# Patient Record
Sex: Female | Born: 1955 | Race: White | Hispanic: No | Marital: Married
Health system: Southern US, Community
[De-identification: ages and names within clinical notes are randomized; demographics above are authoritative.]

---

## 2000-01-27 ENCOUNTER — Encounter: Admission: RE | Admit: 2000-01-27 | Discharge: 2000-01-27 | Payer: Self-pay | Admitting: *Deleted

## 2000-01-27 ENCOUNTER — Encounter: Payer: Self-pay | Admitting: *Deleted

## 2001-04-11 ENCOUNTER — Other Ambulatory Visit: Admission: RE | Admit: 2001-04-11 | Discharge: 2001-04-11 | Payer: Self-pay | Admitting: *Deleted

## 2001-04-17 ENCOUNTER — Encounter: Admission: RE | Admit: 2001-04-17 | Discharge: 2001-04-17 | Payer: Self-pay | Admitting: *Deleted

## 2001-04-17 ENCOUNTER — Encounter: Payer: Self-pay | Admitting: *Deleted

## 2002-05-16 ENCOUNTER — Encounter: Payer: Self-pay | Admitting: Internal Medicine

## 2002-05-16 ENCOUNTER — Encounter: Admission: RE | Admit: 2002-05-16 | Discharge: 2002-05-16 | Payer: Self-pay | Admitting: Internal Medicine

## 2002-06-17 ENCOUNTER — Other Ambulatory Visit: Admission: RE | Admit: 2002-06-17 | Discharge: 2002-06-17 | Payer: Self-pay | Admitting: Internal Medicine

## 2002-06-24 ENCOUNTER — Encounter: Admission: RE | Admit: 2002-06-24 | Discharge: 2002-06-24 | Payer: Self-pay | Admitting: Internal Medicine

## 2002-06-24 ENCOUNTER — Encounter: Payer: Self-pay | Admitting: Internal Medicine

## 2003-08-27 ENCOUNTER — Encounter: Payer: Self-pay | Admitting: Internal Medicine

## 2003-08-27 ENCOUNTER — Encounter: Admission: RE | Admit: 2003-08-27 | Discharge: 2003-08-27 | Payer: Self-pay | Admitting: Internal Medicine

## 2004-10-21 ENCOUNTER — Ambulatory Visit (HOSPITAL_COMMUNITY): Admission: RE | Admit: 2004-10-21 | Discharge: 2004-10-21 | Payer: Self-pay | Admitting: Internal Medicine

## 2005-02-10 ENCOUNTER — Other Ambulatory Visit: Admission: RE | Admit: 2005-02-10 | Discharge: 2005-02-10 | Payer: Self-pay | Admitting: Internal Medicine

## 2005-02-24 ENCOUNTER — Encounter: Admission: RE | Admit: 2005-02-24 | Discharge: 2005-02-24 | Payer: Self-pay | Admitting: Internal Medicine

## 2006-01-13 ENCOUNTER — Ambulatory Visit (HOSPITAL_COMMUNITY): Admission: RE | Admit: 2006-01-13 | Discharge: 2006-01-13 | Payer: Self-pay | Admitting: Internal Medicine

## 2006-03-30 ENCOUNTER — Other Ambulatory Visit: Admission: RE | Admit: 2006-03-30 | Discharge: 2006-03-30 | Payer: Self-pay | Admitting: Internal Medicine

## 2006-04-06 ENCOUNTER — Encounter: Admission: RE | Admit: 2006-04-06 | Discharge: 2006-04-06 | Payer: Self-pay | Admitting: Internal Medicine

## 2007-05-03 ENCOUNTER — Ambulatory Visit (HOSPITAL_COMMUNITY): Admission: RE | Admit: 2007-05-03 | Discharge: 2007-05-03 | Payer: Self-pay | Admitting: Family Medicine

## 2007-06-01 ENCOUNTER — Other Ambulatory Visit: Admission: RE | Admit: 2007-06-01 | Discharge: 2007-06-01 | Payer: Self-pay | Admitting: Family Medicine

## 2007-10-25 ENCOUNTER — Ambulatory Visit (HOSPITAL_COMMUNITY): Admission: RE | Admit: 2007-10-25 | Discharge: 2007-10-25 | Payer: Self-pay | Admitting: Gastroenterology

## 2008-05-15 ENCOUNTER — Ambulatory Visit (HOSPITAL_COMMUNITY): Admission: RE | Admit: 2008-05-15 | Discharge: 2008-05-15 | Payer: Self-pay

## 2009-07-17 ENCOUNTER — Ambulatory Visit (HOSPITAL_COMMUNITY): Admission: RE | Admit: 2009-07-17 | Discharge: 2009-07-17 | Payer: Self-pay | Admitting: Family Medicine

## 2010-09-16 ENCOUNTER — Ambulatory Visit (HOSPITAL_COMMUNITY): Admission: RE | Admit: 2010-09-16 | Discharge: 2010-09-16 | Payer: Self-pay | Admitting: Family Medicine

## 2011-07-07 ENCOUNTER — Other Ambulatory Visit: Payer: Self-pay | Admitting: Family Medicine

## 2011-07-07 ENCOUNTER — Other Ambulatory Visit (HOSPITAL_COMMUNITY)
Admission: RE | Admit: 2011-07-07 | Discharge: 2011-07-07 | Disposition: A | Discharge status: Home / Self Care | Payer: BC Managed Care – PPO | Source: Ambulatory Visit | Attending: Family Medicine | Admitting: Family Medicine

## 2011-07-07 DIAGNOSIS — Z124 Encounter for screening for malignant neoplasm of cervix: Secondary | ICD-10-CM | POA: Insufficient documentation

## 2011-07-08 ENCOUNTER — Other Ambulatory Visit (HOSPITAL_COMMUNITY): Payer: Self-pay | Admitting: Family Medicine

## 2011-07-08 DIAGNOSIS — Z1231 Encounter for screening mammogram for malignant neoplasm of breast: Secondary | ICD-10-CM

## 2011-09-22 ENCOUNTER — Ambulatory Visit (HOSPITAL_COMMUNITY)
Admission: RE | Admit: 2011-09-22 | Discharge: 2011-09-22 | Disposition: A | Discharge status: Home / Self Care | Payer: BC Managed Care – PPO | Source: Ambulatory Visit | Attending: Family Medicine | Admitting: Family Medicine

## 2011-09-22 DIAGNOSIS — Z1231 Encounter for screening mammogram for malignant neoplasm of breast: Secondary | ICD-10-CM

## 2012-09-28 ENCOUNTER — Other Ambulatory Visit (HOSPITAL_COMMUNITY): Payer: Self-pay | Admitting: Family Medicine

## 2012-09-28 DIAGNOSIS — Z1231 Encounter for screening mammogram for malignant neoplasm of breast: Secondary | ICD-10-CM

## 2012-10-17 ENCOUNTER — Ambulatory Visit (HOSPITAL_COMMUNITY)
Admission: RE | Admit: 2012-10-17 | Discharge: 2012-10-17 | Disposition: A | Discharge status: Home / Self Care | Payer: BC Managed Care – PPO | Source: Ambulatory Visit | Attending: Family Medicine | Admitting: Family Medicine

## 2012-10-17 DIAGNOSIS — Z1231 Encounter for screening mammogram for malignant neoplasm of breast: Secondary | ICD-10-CM

## 2013-05-22 ENCOUNTER — Other Ambulatory Visit (HOSPITAL_COMMUNITY): Payer: Self-pay | Admitting: Family Medicine

## 2013-05-22 DIAGNOSIS — M810 Age-related osteoporosis without current pathological fracture: Secondary | ICD-10-CM

## 2013-09-23 ENCOUNTER — Ambulatory Visit (HOSPITAL_COMMUNITY)
Admission: RE | Admit: 2013-09-23 | Discharge: 2013-09-23 | Disposition: A | Discharge status: Home / Self Care | Payer: BC Managed Care – PPO | Source: Ambulatory Visit | Attending: Family Medicine | Admitting: Family Medicine

## 2013-09-23 DIAGNOSIS — Z13820 Encounter for screening for osteoporosis: Secondary | ICD-10-CM | POA: Insufficient documentation

## 2013-09-23 DIAGNOSIS — M810 Age-related osteoporosis without current pathological fracture: Secondary | ICD-10-CM

## 2013-09-23 DIAGNOSIS — Z780 Asymptomatic menopausal state: Secondary | ICD-10-CM | POA: Insufficient documentation

## 2013-10-30 ENCOUNTER — Other Ambulatory Visit (HOSPITAL_COMMUNITY): Payer: Self-pay | Admitting: Family Medicine

## 2013-10-30 ENCOUNTER — Ambulatory Visit (HOSPITAL_COMMUNITY): Payer: BC Managed Care – PPO | Attending: Family Medicine

## 2013-10-30 DIAGNOSIS — Z1231 Encounter for screening mammogram for malignant neoplasm of breast: Secondary | ICD-10-CM

## 2013-11-08 ENCOUNTER — Ambulatory Visit (HOSPITAL_COMMUNITY)
Admission: RE | Admit: 2013-11-08 | Discharge: 2013-11-08 | Disposition: A | Discharge status: Home / Self Care | Payer: BC Managed Care – PPO | Source: Ambulatory Visit | Attending: Family Medicine | Admitting: Family Medicine

## 2013-11-08 DIAGNOSIS — Z1231 Encounter for screening mammogram for malignant neoplasm of breast: Secondary | ICD-10-CM | POA: Insufficient documentation

## 2014-11-17 ENCOUNTER — Other Ambulatory Visit (HOSPITAL_COMMUNITY): Payer: Self-pay | Admitting: Family Medicine

## 2014-11-17 DIAGNOSIS — Z1231 Encounter for screening mammogram for malignant neoplasm of breast: Secondary | ICD-10-CM

## 2014-11-18 ENCOUNTER — Ambulatory Visit (HOSPITAL_COMMUNITY)
Admission: RE | Admit: 2014-11-18 | Discharge: 2014-11-18 | Disposition: A | Discharge status: Home / Self Care | Payer: BC Managed Care – PPO | Source: Ambulatory Visit | Attending: Family Medicine | Admitting: Family Medicine

## 2014-11-18 DIAGNOSIS — Z1231 Encounter for screening mammogram for malignant neoplasm of breast: Secondary | ICD-10-CM | POA: Diagnosis present

## 2016-01-07 ENCOUNTER — Other Ambulatory Visit: Payer: Self-pay

## 2016-01-07 DIAGNOSIS — Z1231 Encounter for screening mammogram for malignant neoplasm of breast: Secondary | ICD-10-CM

## 2016-01-20 ENCOUNTER — Ambulatory Visit
Admission: RE | Admit: 2016-01-20 | Discharge: 2016-01-20 | Disposition: A | Discharge status: Home / Self Care | Payer: BLUE CROSS/BLUE SHIELD | Source: Ambulatory Visit

## 2016-01-20 DIAGNOSIS — Z1231 Encounter for screening mammogram for malignant neoplasm of breast: Secondary | ICD-10-CM

## 2016-04-27 ENCOUNTER — Other Ambulatory Visit (HOSPITAL_COMMUNITY)
Admission: RE | Admit: 2016-04-27 | Discharge: 2016-04-27 | Disposition: A | Discharge status: Home / Self Care | Payer: BLUE CROSS/BLUE SHIELD | Source: Ambulatory Visit | Attending: Family Medicine | Admitting: Family Medicine

## 2016-04-27 ENCOUNTER — Other Ambulatory Visit: Payer: Self-pay | Admitting: Family Medicine

## 2016-04-27 DIAGNOSIS — Z124 Encounter for screening for malignant neoplasm of cervix: Secondary | ICD-10-CM | POA: Diagnosis present

## 2016-04-27 DIAGNOSIS — Z1151 Encounter for screening for human papillomavirus (HPV): Secondary | ICD-10-CM | POA: Insufficient documentation

## 2016-04-28 LAB — CYTOLOGY - PAP

## 2016-05-10 ENCOUNTER — Other Ambulatory Visit: Payer: Self-pay | Admitting: Surgery

## 2016-05-11 ENCOUNTER — Other Ambulatory Visit: Payer: Self-pay | Admitting: Surgery

## 2016-05-11 DIAGNOSIS — R1901 Right upper quadrant abdominal swelling, mass and lump: Secondary | ICD-10-CM

## 2016-05-16 ENCOUNTER — Ambulatory Visit
Admission: RE | Admit: 2016-05-16 | Discharge: 2016-05-16 | Disposition: A | Discharge status: Home / Self Care | Payer: BLUE CROSS/BLUE SHIELD | Source: Ambulatory Visit | Attending: Surgery | Admitting: Surgery

## 2016-05-16 DIAGNOSIS — R1901 Right upper quadrant abdominal swelling, mass and lump: Secondary | ICD-10-CM

## 2016-05-16 MED ORDER — IOPAMIDOL (ISOVUE-300) INJECTION 61%
100.0000 mL | Freq: Once | INTRAVENOUS | Status: AC | PRN
Start: 2016-05-16 — End: 2016-05-16
  Administered 2016-05-16: 100 mL via INTRAVENOUS

## 2017-03-06 ENCOUNTER — Other Ambulatory Visit: Payer: Self-pay | Admitting: Family Medicine

## 2017-03-06 DIAGNOSIS — Z1231 Encounter for screening mammogram for malignant neoplasm of breast: Secondary | ICD-10-CM

## 2017-03-28 ENCOUNTER — Ambulatory Visit
Admission: RE | Admit: 2017-03-28 | Discharge: 2017-03-28 | Disposition: A | Discharge status: Home / Self Care | Payer: BLUE CROSS/BLUE SHIELD | Source: Ambulatory Visit | Attending: Family Medicine | Admitting: Family Medicine

## 2017-03-28 DIAGNOSIS — Z1231 Encounter for screening mammogram for malignant neoplasm of breast: Secondary | ICD-10-CM

## 2018-01-29 DIAGNOSIS — H2513 Age-related nuclear cataract, bilateral: Secondary | ICD-10-CM | POA: Diagnosis not present

## 2018-01-29 DIAGNOSIS — H401131 Primary open-angle glaucoma, bilateral, mild stage: Secondary | ICD-10-CM | POA: Diagnosis not present

## 2018-04-26 ENCOUNTER — Other Ambulatory Visit: Payer: Self-pay | Admitting: Family Medicine

## 2018-04-26 DIAGNOSIS — Z1231 Encounter for screening mammogram for malignant neoplasm of breast: Secondary | ICD-10-CM

## 2018-05-14 DIAGNOSIS — H2513 Age-related nuclear cataract, bilateral: Secondary | ICD-10-CM | POA: Diagnosis not present

## 2018-05-14 DIAGNOSIS — H401131 Primary open-angle glaucoma, bilateral, mild stage: Secondary | ICD-10-CM | POA: Diagnosis not present

## 2018-05-21 ENCOUNTER — Ambulatory Visit
Admission: RE | Admit: 2018-05-21 | Discharge: 2018-05-21 | Disposition: A | Discharge status: Home / Self Care | Payer: BLUE CROSS/BLUE SHIELD | Source: Ambulatory Visit | Attending: Family Medicine | Admitting: Family Medicine

## 2018-05-21 DIAGNOSIS — Z1231 Encounter for screening mammogram for malignant neoplasm of breast: Secondary | ICD-10-CM

## 2018-06-04 DIAGNOSIS — Z0000 Encounter for general adult medical examination without abnormal findings: Secondary | ICD-10-CM | POA: Diagnosis not present

## 2018-06-04 DIAGNOSIS — M810 Age-related osteoporosis without current pathological fracture: Secondary | ICD-10-CM | POA: Diagnosis not present

## 2018-06-04 DIAGNOSIS — Z131 Encounter for screening for diabetes mellitus: Secondary | ICD-10-CM | POA: Diagnosis not present

## 2018-06-04 DIAGNOSIS — Z13220 Encounter for screening for lipoid disorders: Secondary | ICD-10-CM | POA: Diagnosis not present

## 2018-06-07 ENCOUNTER — Other Ambulatory Visit: Payer: Self-pay | Admitting: Family Medicine

## 2018-06-07 DIAGNOSIS — M810 Age-related osteoporosis without current pathological fracture: Secondary | ICD-10-CM

## 2018-08-02 ENCOUNTER — Ambulatory Visit
Admission: RE | Admit: 2018-08-02 | Discharge: 2018-08-02 | Disposition: A | Discharge status: Home / Self Care | Payer: BLUE CROSS/BLUE SHIELD | Source: Ambulatory Visit | Attending: Family Medicine | Admitting: Family Medicine

## 2018-08-02 DIAGNOSIS — Z780 Asymptomatic menopausal state: Secondary | ICD-10-CM | POA: Diagnosis not present

## 2018-08-02 DIAGNOSIS — M85852 Other specified disorders of bone density and structure, left thigh: Secondary | ICD-10-CM | POA: Diagnosis not present

## 2018-08-02 DIAGNOSIS — M810 Age-related osteoporosis without current pathological fracture: Secondary | ICD-10-CM

## 2018-09-06 DIAGNOSIS — Z01818 Encounter for other preprocedural examination: Secondary | ICD-10-CM | POA: Diagnosis not present

## 2018-10-10 DIAGNOSIS — R6889 Other general symptoms and signs: Secondary | ICD-10-CM | POA: Diagnosis not present

## 2018-12-25 DIAGNOSIS — Z1211 Encounter for screening for malignant neoplasm of colon: Secondary | ICD-10-CM | POA: Diagnosis not present

## 2019-01-17 DIAGNOSIS — H401131 Primary open-angle glaucoma, bilateral, mild stage: Secondary | ICD-10-CM | POA: Diagnosis not present

## 2019-09-16 DIAGNOSIS — M810 Age-related osteoporosis without current pathological fracture: Secondary | ICD-10-CM | POA: Diagnosis not present

## 2020-01-23 DIAGNOSIS — H0288A Meibomian gland dysfunction right eye, upper and lower eyelids: Secondary | ICD-10-CM | POA: Diagnosis not present

## 2020-01-23 DIAGNOSIS — H524 Presbyopia: Secondary | ICD-10-CM | POA: Diagnosis not present

## 2020-01-23 DIAGNOSIS — H0288B Meibomian gland dysfunction left eye, upper and lower eyelids: Secondary | ICD-10-CM | POA: Diagnosis not present

## 2020-01-23 DIAGNOSIS — H52202 Unspecified astigmatism, left eye: Secondary | ICD-10-CM | POA: Diagnosis not present

## 2020-01-23 DIAGNOSIS — H5203 Hypermetropia, bilateral: Secondary | ICD-10-CM | POA: Diagnosis not present

## 2020-01-23 DIAGNOSIS — H401131 Primary open-angle glaucoma, bilateral, mild stage: Secondary | ICD-10-CM | POA: Diagnosis not present

## 2020-01-23 DIAGNOSIS — H04123 Dry eye syndrome of bilateral lacrimal glands: Secondary | ICD-10-CM | POA: Diagnosis not present

## 2020-02-07 ENCOUNTER — Ambulatory Visit: Payer: BC Managed Care – PPO | Attending: Internal Medicine

## 2020-02-07 DIAGNOSIS — Z23 Encounter for immunization: Secondary | ICD-10-CM

## 2020-02-07 NOTE — Progress Notes (Signed)
   Covid-19 Vaccination Clinic  Name:  Isabella Romero    MRN: [id number] DOB: 11-20-1956  02/07/2020  [name] was observed post Covid-19 immunization for 15 minutes without incidence. She was provided with Vaccine Information Sheet and instruction to access the V-Safe system.   [name] was instructed to call 911 with any severe reactions post vaccine: Marland Kitchen Difficulty breathing  . Swelling of your face and throat  . A fast heartbeat  . A bad rash all over your body  . Dizziness and weakness    Immunizations Administered    Name Date Dose VIS Date Route   Pfizer COVID-19 Vaccine 02/07/2020  8:13 AM 0.3 mL 11/22/2019 Intramuscular   Manufacturer: [employer], [employer]   Lot: [id number]   NDC: [address]-3

## 2020-03-03 ENCOUNTER — Ambulatory Visit: Payer: BC Managed Care – PPO | Attending: Internal Medicine

## 2020-03-03 DIAGNOSIS — Z23 Encounter for immunization: Secondary | ICD-10-CM

## 2020-03-03 NOTE — Progress Notes (Signed)
   Covid-19 Vaccination Clinic  Name:  Isabella Romero    MRN: [id number] DOB: 07-11-1956  03/03/2020  Isabella Romero was observed post Covid-19 immunization for 15 minutes without incident. She was provided with Vaccine Information Sheet and instruction to access the V-Safe system.   Isabella Romero was instructed to call 911 with any severe reactions post vaccine: Marland Kitchen Difficulty breathing  . Swelling of face and throat  . A fast heartbeat  . A bad rash all over body  . Dizziness and weakness   Immunizations Administered    Name Date Dose VIS Date Route   Pfizer COVID-19 Vaccine 03/03/2020  9:08 AM 0.3 mL 11/22/2019 Intramuscular   Manufacturer: [employer], [employer]   Lot: [id number]   NDC: [address]-8

## 2020-05-01 ENCOUNTER — Other Ambulatory Visit: Payer: Self-pay | Admitting: Family Medicine

## 2020-05-01 DIAGNOSIS — Z1231 Encounter for screening mammogram for malignant neoplasm of breast: Secondary | ICD-10-CM

## 2020-06-09 DIAGNOSIS — M79671 Pain in right foot: Secondary | ICD-10-CM | POA: Diagnosis not present

## 2021-01-21 ENCOUNTER — Other Ambulatory Visit: Payer: Self-pay | Admitting: Family Medicine

## 2021-01-21 DIAGNOSIS — M810 Age-related osteoporosis without current pathological fracture: Secondary | ICD-10-CM

## 2021-02-05 ENCOUNTER — Ambulatory Visit
Admission: RE | Admit: 2021-02-05 | Discharge: 2021-02-05 | Disposition: A | Discharge status: Home / Self Care | Payer: BC Managed Care – PPO | Source: Ambulatory Visit | Attending: Family Medicine | Admitting: Family Medicine

## 2021-02-05 ENCOUNTER — Other Ambulatory Visit: Payer: Self-pay

## 2021-02-05 DIAGNOSIS — Z1231 Encounter for screening mammogram for malignant neoplasm of breast: Secondary | ICD-10-CM

## 2021-07-05 ENCOUNTER — Other Ambulatory Visit: Payer: Self-pay

## 2021-07-05 ENCOUNTER — Ambulatory Visit
Admission: RE | Admit: 2021-07-05 | Discharge: 2021-07-05 | Disposition: A | Discharge status: Home / Self Care | Payer: BC Managed Care – PPO | Source: Ambulatory Visit | Attending: Family Medicine | Admitting: Family Medicine

## 2021-07-05 DIAGNOSIS — M810 Age-related osteoporosis without current pathological fracture: Secondary | ICD-10-CM

## 2022-01-25 ENCOUNTER — Other Ambulatory Visit: Payer: Self-pay | Admitting: Family Medicine

## 2022-01-25 DIAGNOSIS — Z1231 Encounter for screening mammogram for malignant neoplasm of breast: Secondary | ICD-10-CM

## 2022-02-10 ENCOUNTER — Ambulatory Visit
Admission: RE | Admit: 2022-02-10 | Discharge: 2022-02-10 | Disposition: A | Discharge status: Home / Self Care | Payer: BC Managed Care – PPO | Source: Ambulatory Visit | Attending: Family Medicine | Admitting: Family Medicine

## 2022-02-10 DIAGNOSIS — Z1231 Encounter for screening mammogram for malignant neoplasm of breast: Secondary | ICD-10-CM

## 2022-02-11 ENCOUNTER — Other Ambulatory Visit: Payer: Self-pay | Admitting: Family Medicine

## 2022-02-11 DIAGNOSIS — N6310 Unspecified lump in the right breast, unspecified quadrant: Secondary | ICD-10-CM

## 2022-02-28 ENCOUNTER — Other Ambulatory Visit: Payer: Self-pay | Admitting: Family Medicine

## 2022-02-28 DIAGNOSIS — R928 Other abnormal and inconclusive findings on diagnostic imaging of breast: Secondary | ICD-10-CM

## 2022-03-01 ENCOUNTER — Other Ambulatory Visit: Payer: BC Managed Care – PPO

## 2022-03-12 ENCOUNTER — Ambulatory Visit
Admission: RE | Admit: 2022-03-12 | Discharge: 2022-03-12 | Disposition: A | Discharge status: Home / Self Care | Payer: BC Managed Care – PPO | Source: Ambulatory Visit | Attending: Family Medicine | Admitting: Family Medicine

## 2022-03-12 DIAGNOSIS — R928 Other abnormal and inconclusive findings on diagnostic imaging of breast: Secondary | ICD-10-CM

## 2022-08-28 IMAGING — MG MM DIGITAL DIAGNOSTIC UNILAT*R* W/ TOMO W/ CAD
6 series · 6 of 18 positions shown · non-contrast
Comparison: Previous exam(s).

CLINICAL DATA: Patient recalled from screening for possible right
breast mass.

EXAM:
DIGITAL DIAGNOSTIC UNILATERAL RIGHT MAMMOGRAM WITH TOMOSYNTHESIS AND
CAD; ULTRASOUND RIGHT BREAST LIMITED
TECHNIQUE: Right digital diagnostic mammography and breast tomosynthesis was
performed. The images were evaluated with computer-aided detection.;
Targeted ultrasound examination of the right breast was performed

[R MLO synth-2D]
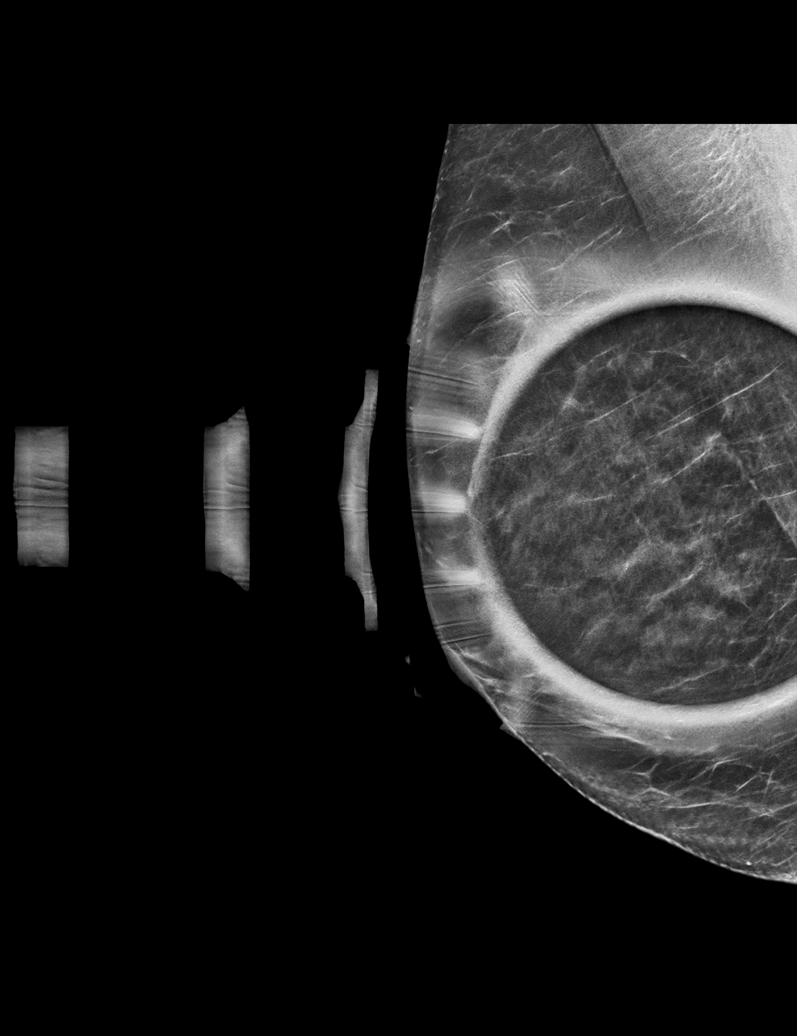

[R ML synth-2D]
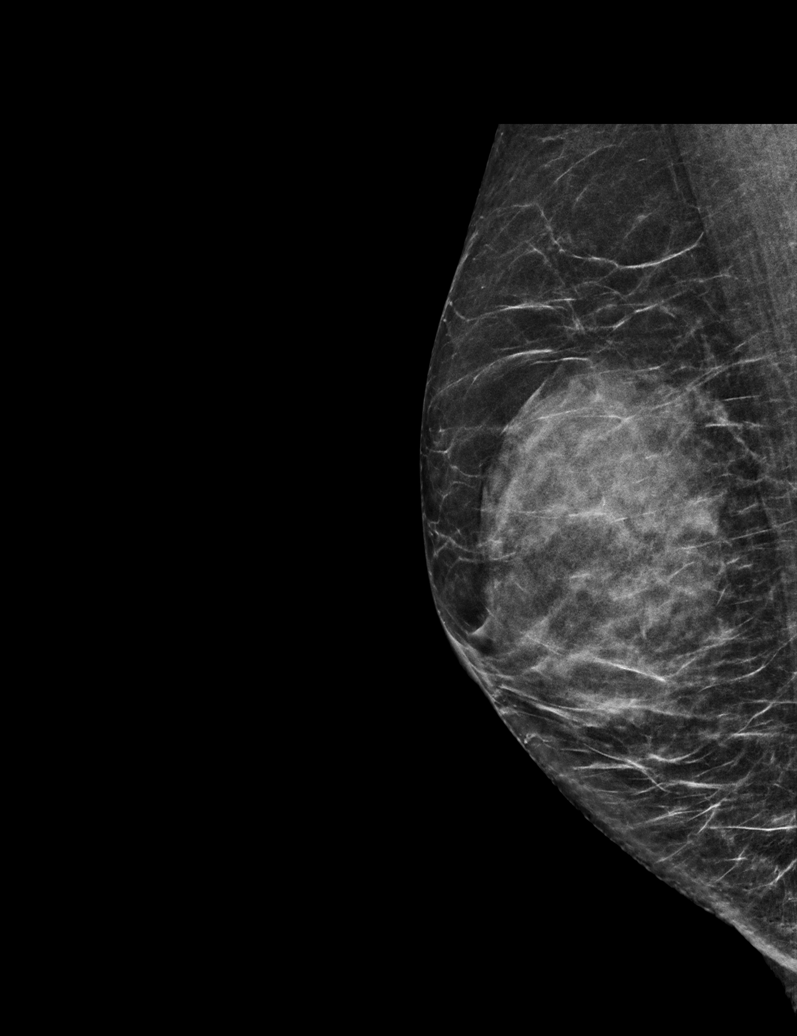

[R CC synth-2D]
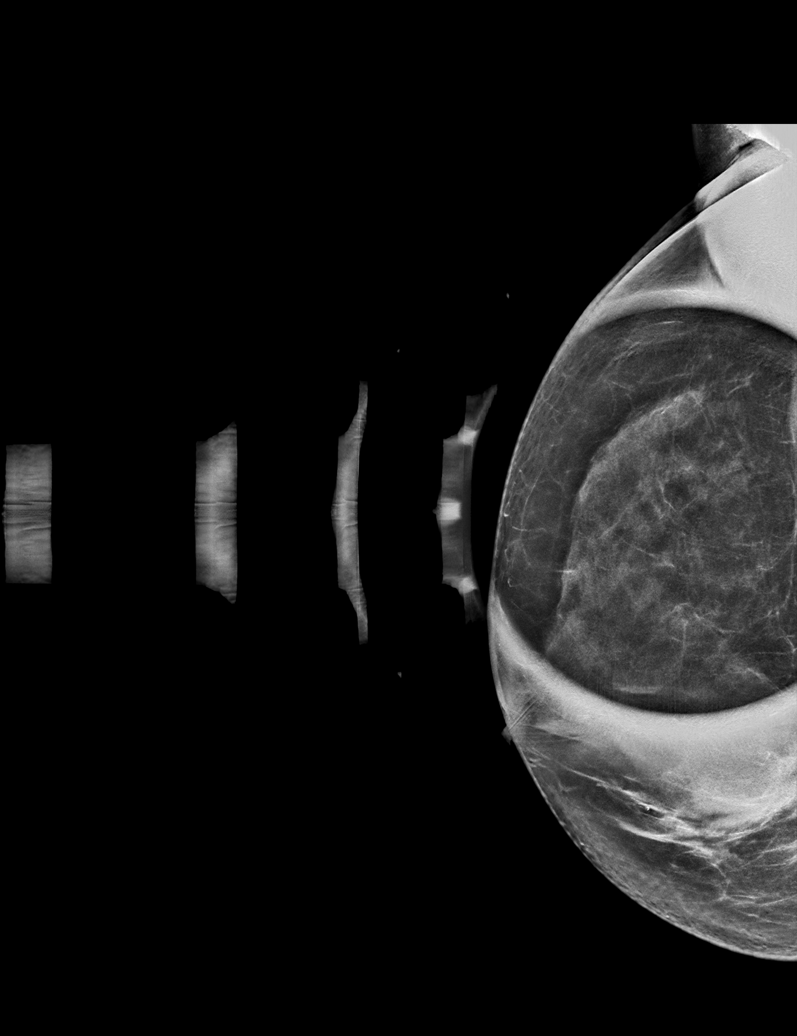

[R MLO tomo · tomo slice 25/50.0]
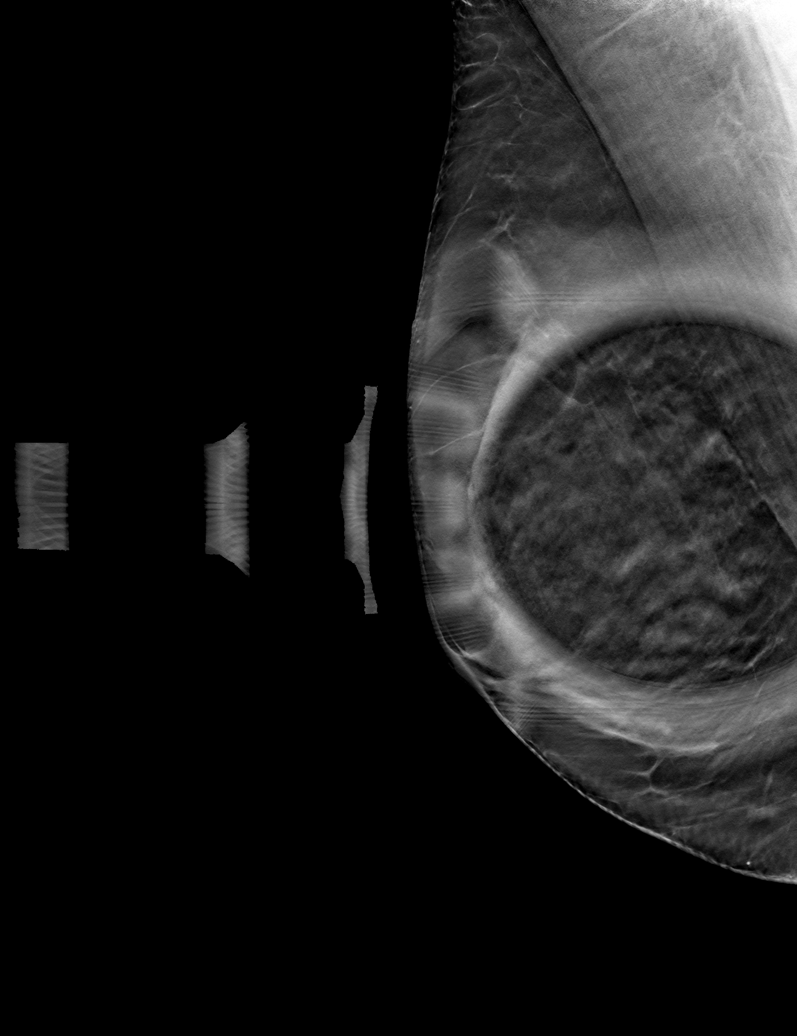

[R CC tomo · tomo slice 31/60.0]
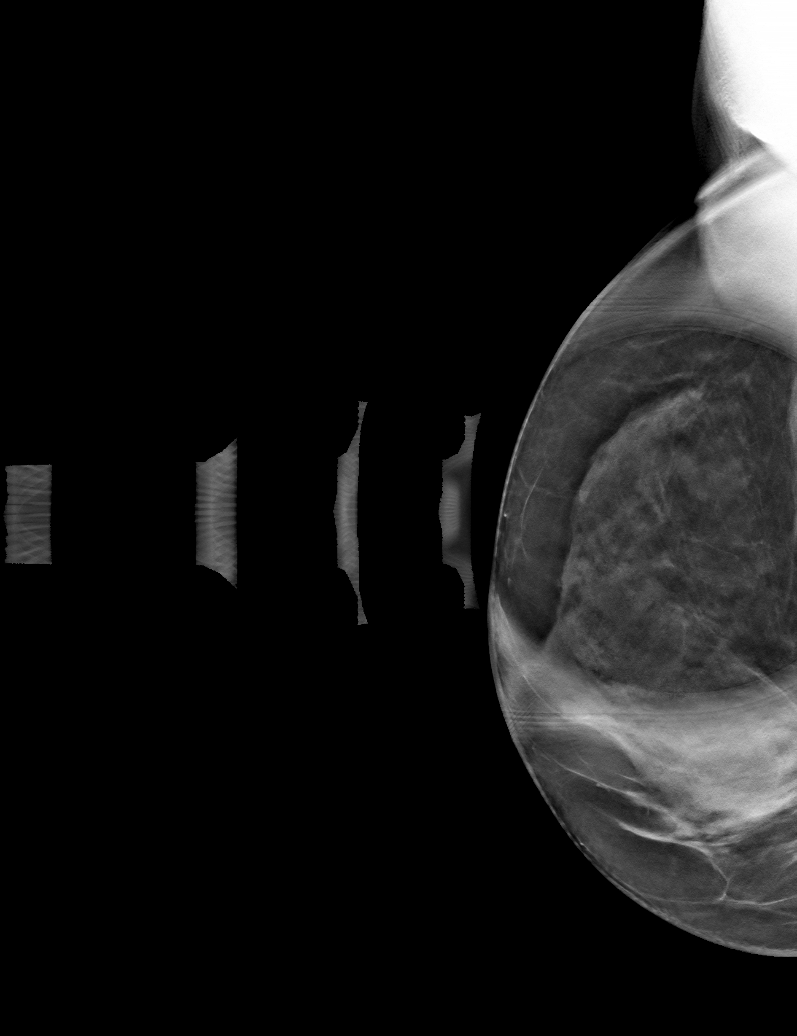

[R ML tomo · tomo slice 26/51.0]
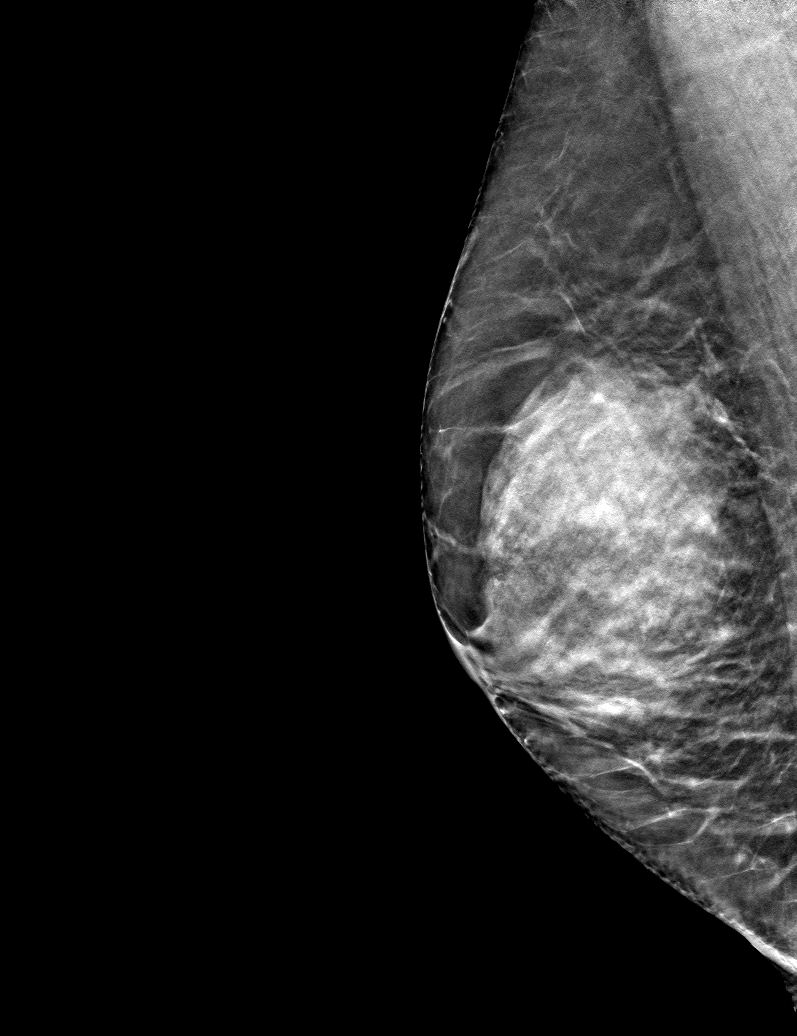

[6 of 18 positions shown; findings below may reference images not displayed]

ACR Breast Density Category c: The breast tissue is heterogeneously
dense, which may obscure small masses.
FINDINGS: Question mass within the outer posterior right breast appear to
efface with additional imaging suggestive of dense fibroglandular
tissue.

Targeted ultrasound is performed, showing normal dense tissue
without suspicious mass within the outer right breast.
IMPRESSION: No mammographic evidence for malignancy.

RECOMMENDATION:
Screening mammogram in one year.(Code:[id number])

I have discussed the findings and recommendations with the patient.
If applicable, a reminder letter will be sent to the patient
regarding the next appointment.

BI-RADS CATEGORY  1: Negative.

## 2023-02-17 ENCOUNTER — Other Ambulatory Visit (HOSPITAL_COMMUNITY): Payer: Self-pay | Admitting: Family Medicine

## 2023-02-17 DIAGNOSIS — E785 Hyperlipidemia, unspecified: Secondary | ICD-10-CM

## 2023-03-08 ENCOUNTER — Ambulatory Visit (HOSPITAL_COMMUNITY)
Admission: RE | Admit: 2023-03-08 | Discharge: 2023-03-08 | Disposition: A | Discharge status: Home / Self Care | Payer: Medicare Other | Source: Ambulatory Visit | Attending: Family Medicine | Admitting: Family Medicine

## 2023-03-08 DIAGNOSIS — E785 Hyperlipidemia, unspecified: Secondary | ICD-10-CM | POA: Insufficient documentation

## 2023-03-17 ENCOUNTER — Other Ambulatory Visit (HOSPITAL_COMMUNITY): Payer: Self-pay | Admitting: Family Medicine

## 2023-03-17 DIAGNOSIS — K7689 Other specified diseases of liver: Secondary | ICD-10-CM

## 2023-04-10 ENCOUNTER — Ambulatory Visit (HOSPITAL_COMMUNITY)
Admission: RE | Admit: 2023-04-10 | Discharge: 2023-04-10 | Disposition: A | Discharge status: Home / Self Care | Payer: Medicare Other | Source: Ambulatory Visit | Attending: Family Medicine | Admitting: Family Medicine

## 2023-04-10 DIAGNOSIS — K7689 Other specified diseases of liver: Secondary | ICD-10-CM | POA: Diagnosis not present

## 2023-06-19 ENCOUNTER — Other Ambulatory Visit: Payer: Self-pay | Admitting: Family Medicine

## 2023-06-19 DIAGNOSIS — Z1231 Encounter for screening mammogram for malignant neoplasm of breast: Secondary | ICD-10-CM

## 2023-06-30 ENCOUNTER — Ambulatory Visit
Admission: RE | Admit: 2023-06-30 | Discharge: 2023-06-30 | Disposition: A | Discharge status: Home / Self Care | Payer: Medicare Other | Source: Ambulatory Visit | Attending: Family Medicine | Admitting: Family Medicine

## 2023-06-30 DIAGNOSIS — Z1231 Encounter for screening mammogram for malignant neoplasm of breast: Secondary | ICD-10-CM

## 2024-03-05 ENCOUNTER — Other Ambulatory Visit: Payer: Self-pay | Admitting: Family Medicine

## 2024-03-05 DIAGNOSIS — M810 Age-related osteoporosis without current pathological fracture: Secondary | ICD-10-CM

## 2024-07-19 ENCOUNTER — Other Ambulatory Visit: Payer: Self-pay | Admitting: Family Medicine

## 2024-07-19 DIAGNOSIS — Z1231 Encounter for screening mammogram for malignant neoplasm of breast: Secondary | ICD-10-CM

## 2024-08-23 ENCOUNTER — Ambulatory Visit
Admission: RE | Admit: 2024-08-23 | Discharge: 2024-08-23 | Disposition: A | Discharge status: Home / Self Care | Source: Ambulatory Visit | Attending: Family Medicine | Admitting: Family Medicine

## 2024-08-23 DIAGNOSIS — Z1231 Encounter for screening mammogram for malignant neoplasm of breast: Secondary | ICD-10-CM

## 2025-03-12 ENCOUNTER — Other Ambulatory Visit (HOSPITAL_BASED_OUTPATIENT_CLINIC_OR_DEPARTMENT_OTHER)
